# Patient Record
Sex: Female | Born: 1955 | Race: White | Hispanic: No | Marital: Married | Smoking: Never smoker
Health system: Southern US, Community
[De-identification: ages and names within clinical notes are randomized; demographics above are authoritative.]

## PROBLEM LIST (undated history)

## (undated) DIAGNOSIS — R928 Other abnormal and inconclusive findings on diagnostic imaging of breast: Secondary | ICD-10-CM

## (undated) DIAGNOSIS — I10 Essential (primary) hypertension: Secondary | ICD-10-CM

## (undated) HISTORY — DX: Essential (primary) hypertension: I10

## (undated) HISTORY — DX: Other abnormal and inconclusive findings on diagnostic imaging of breast: R92.8

---

## 2003-10-08 ENCOUNTER — Encounter: Payer: Self-pay | Admitting: Family Medicine

## 2003-10-08 LAB — CONVERTED CEMR LAB: Pap Smear: NORMAL

## 2004-05-07 DIAGNOSIS — E785 Hyperlipidemia, unspecified: Secondary | ICD-10-CM

## 2004-05-07 DIAGNOSIS — I10 Essential (primary) hypertension: Secondary | ICD-10-CM | POA: Insufficient documentation

## 2004-05-07 DIAGNOSIS — R7309 Other abnormal glucose: Secondary | ICD-10-CM

## 2004-07-13 ENCOUNTER — Ambulatory Visit: Payer: Self-pay | Admitting: Family Medicine

## 2004-12-24 ENCOUNTER — Ambulatory Visit: Payer: Self-pay | Admitting: Family Medicine

## 2005-01-12 ENCOUNTER — Ambulatory Visit: Payer: Self-pay | Admitting: Family Medicine

## 2005-07-15 ENCOUNTER — Ambulatory Visit: Payer: Self-pay | Admitting: Family Medicine

## 2005-07-20 ENCOUNTER — Ambulatory Visit: Payer: Self-pay | Admitting: Unknown Physician Specialty

## 2005-08-03 ENCOUNTER — Ambulatory Visit: Payer: Self-pay | Admitting: Family Medicine

## 2007-05-24 ENCOUNTER — Encounter: Payer: Self-pay | Admitting: Family Medicine

## 2007-06-20 ENCOUNTER — Ambulatory Visit: Payer: Self-pay | Admitting: Family Medicine

## 2007-06-20 LAB — CONVERTED CEMR LAB
Bilirubin, Direct: 0.1 mg/dL (ref 0.0–0.3)
CO2: 29 meq/L (ref 19–32)
Cholesterol: 192 mg/dL (ref 0–200)
GFR calc Af Amer: 113 mL/min
Glucose, Bld: 113 mg/dL — ABNORMAL HIGH (ref 70–99)
HDL: 35.2 mg/dL — ABNORMAL LOW (ref >39.0)
Microalb Creat Ratio: 5.3 mg/g (ref 0.0–30.0)
Microalb, Ur: 0.6 mg/dL (ref 0.0–1.9)
Potassium: 4.1 meq/L (ref 3.5–5.1)
Total Bilirubin: 0.8 mg/dL (ref 0.3–1.2)
Total Protein: 6.7 g/dL (ref 6.0–8.3)
Triglycerides: 277 mg/dL (ref 0–149)

## 2007-06-22 ENCOUNTER — Ambulatory Visit: Payer: Self-pay | Admitting: Family Medicine

## 2007-09-04 ENCOUNTER — Ambulatory Visit: Payer: Self-pay | Admitting: Family Medicine

## 2007-09-04 DIAGNOSIS — J069 Acute upper respiratory infection, unspecified: Secondary | ICD-10-CM | POA: Insufficient documentation

## 2007-09-19 ENCOUNTER — Ambulatory Visit: Payer: Self-pay | Admitting: Unknown Physician Specialty

## 2007-09-26 ENCOUNTER — Ambulatory Visit: Payer: Self-pay | Admitting: Unknown Physician Specialty

## 2008-11-26 ENCOUNTER — Ambulatory Visit: Payer: Self-pay | Admitting: Unknown Physician Specialty

## 2009-02-12 LAB — CONVERTED CEMR LAB: Pap Smear: NORMAL

## 2009-04-07 ENCOUNTER — Ambulatory Visit: Payer: Self-pay | Admitting: Family Medicine

## 2009-04-07 LAB — CONVERTED CEMR LAB
Albumin: 3.8 g/dL (ref 3.5–5.2)
Alkaline Phosphatase: 53 units/L (ref 39–117)
Bilirubin, Direct: 0.1 mg/dL (ref 0.0–0.3)
CO2: 30 meq/L (ref 19–32)
Chloride: 107 meq/L (ref 96–112)
Cholesterol: 193 mg/dL (ref 0–200)
Creatinine, Ser: 0.7 mg/dL (ref 0.4–1.2)
Direct LDL: 114.1 mg/dL
HDL: 41.2 mg/dL (ref >39.00)
Microalb Creat Ratio: 8.7 mg/g (ref 0.0–30.0)
Microalb, Ur: 1.3 mg/dL (ref 0.0–1.9)
Potassium: 4.1 meq/L (ref 3.5–5.1)
TSH: 1.07 microintl units/mL (ref 0.35–5.50)
Total CHOL/HDL Ratio: 5
VLDL: 52.4 mg/dL — ABNORMAL HIGH (ref 0.0–40.0)

## 2009-04-15 ENCOUNTER — Ambulatory Visit: Payer: Self-pay | Admitting: Family Medicine

## 2010-04-14 ENCOUNTER — Encounter (INDEPENDENT_AMBULATORY_CARE_PROVIDER_SITE_OTHER): Payer: Self-pay | Admitting: *Deleted

## 2010-07-21 ENCOUNTER — Ambulatory Visit: Payer: Self-pay | Admitting: Unknown Physician Specialty

## 2010-10-07 NOTE — Letter (Signed)
Summary: Nadara Eaton letter  [facility]  [address] [place], [place] [address]   Phone: [phone]  Fax: [phone]       04/14/2010 MRN: [id number]  [facility] [address] [place], [place]  [address]  Dear Ms. Zella Ball,  [facility] [place], and [facility] announce the retirement of Arta Silence, M.D., from full-time practice at the [facility] office effective March 05, 2010 and his plans of returning part-time.  It is important to Dr. Hetty Ely and to our practice that you understand that [facility] has seven physicians in our office for your health care needs.  We will continue to offer the same exceptional care that you have today.    Dr. Hetty Ely has spoken to many of you about his plans for retirement and returning part-time in the fall.   We will continue to work with you through the transition to schedule appointments for you in the office and meet the high standards that [place] is committed to.   Again, it is with great pleasure that we share the news that Dr. Hetty Ely will return to [facility] in October of 2011 with a reduced schedule.    If you have any questions, or would like to request an appointment with one of our physicians, please call us at [phone] and press the option for Scheduling an appointment.  We take pleasure in providing you with excellent patient care and look forward to seeing you at your next office visit.  Our [facility] Physicians are:  Tillman Abide, M.D. Laurita Quint, M.D. Roxy Manns, M.D. Kerby Nora, M.D. Hannah Beat, M.D. Ruthe Mannan, M.D. We proudly welcomed Raechel Ache, M.D. and Eustaquio Boyden, M.D. to the practice in July/August 2011.  Sincerely,  [facility]

## 2012-12-26 ENCOUNTER — Ambulatory Visit: Payer: Self-pay | Admitting: Obstetrics & Gynecology

## 2013-01-22 ENCOUNTER — Ambulatory Visit: Payer: Self-pay | Admitting: General Surgery

## 2013-01-31 ENCOUNTER — Encounter: Payer: Self-pay | Admitting: *Deleted

## 2013-02-04 HISTORY — PX: BREAST CYST ASPIRATION: SHX578

## 2013-02-13 ENCOUNTER — Other Ambulatory Visit: Payer: Self-pay

## 2013-02-13 ENCOUNTER — Ambulatory Visit (INDEPENDENT_AMBULATORY_CARE_PROVIDER_SITE_OTHER): Payer: BC Managed Care – PPO | Admitting: General Surgery

## 2013-02-13 ENCOUNTER — Encounter: Payer: Self-pay | Admitting: General Surgery

## 2013-02-13 VITALS — BP 140/76 | HR 72 | Resp 14 | Ht 66.0 in | Wt 183.0 lb

## 2013-02-13 DIAGNOSIS — N63 Unspecified lump in unspecified breast: Secondary | ICD-10-CM

## 2013-02-13 DIAGNOSIS — R928 Other abnormal and inconclusive findings on diagnostic imaging of breast: Secondary | ICD-10-CM

## 2013-02-13 NOTE — Patient Instructions (Signed)
Patient to return 3 months.

## 2013-02-13 NOTE — Progress Notes (Signed)
Patient ID: Maria Vang, female   DOB: [birth year], 57 y.o.   MRN: [id number]  Chief Complaint  Patient presents with  . Breast Problem    abnormal mammogram category 4    HPI Maria Vang is a 57 y.o. female who presents for an abnormal mammogram. The most recent mammogram was done on 12/26/12 with a birad category 4. A left breast ultrasound was done at this time with a birad category 4. Patient admits performing regular self breast checks and gets regular mammograms done. No family or personal history of breast problems. She states no complaints at this time.  The patient had initially reported some ill-defined thickening this area at the time of her recent GYN exam. The areas no longer as distinct as it had been in the past.  HPI  Past Medical History  Diagnosis Date  . Hypertension   . Abnormal mammogram 02/14/2013    History reviewed. No pertinent past surgical history.  History reviewed. No pertinent family history.  Social History History  Substance Use Topics  . Smoking status: Never Smoker   . Smokeless tobacco: Never Used  . Alcohol Use: Yes    No Known Allergies  Current Outpatient Prescriptions  Medication Sig Dispense Refill  . metoprolol succinate (TOPROL-XL) 25 MG 24 hr tablet Take 0.5 tablets by mouth daily.       No current facility-administered medications for this visit.    Review of Systems Review of Systems  Constitutional: Negative.   Respiratory: Negative.   Cardiovascular: Negative.     Blood pressure 140/76, pulse 72, resp. rate 14, height 5\' 6"  (1.676 m), weight 183 lb (83.008 kg).  Physical Exam Physical Exam  Constitutional: She appears well-developed and well-nourished.  Neck: Trachea normal. No mass and no thyromegaly present.  Cardiovascular: Normal rate, regular rhythm, normal heart sounds and normal pulses.   No murmur heard. Pulmonary/Chest: Effort normal and breath sounds normal. Right breast exhibits no inverted nipple, no  mass, no nipple discharge, no skin change and no tenderness. Left breast exhibits no inverted nipple, no mass, no nipple discharge, no skin change and no tenderness. Breasts are symmetrical.  Lymphadenopathy:    She has no cervical adenopathy.    She has no axillary adenopathy.    Data Reviewed Bilateral mammograms dated 12/26/2012 for a reported mass in the 3:00 position of the left breast were reviewed. A nodular density in the area of concern left breast was identified. Ultrasound showed 2 adjacent lesions the largest measuring just under 1.3 cm, the smallest measuring under 0.6 cm BI-RAD-4.  Ultrasound examination of the left breast in the 2:00 position 4 cm from the nipple showed adjacent nodules measuring just under 0.6 cm in aggregate. These were aspirated with complete resolution using 1 cc of 1% plain Xylocaine. At the 2:30 o'clock position, again 4 cm from the nipple a 0.36 x 0.66 x 0.55 cm slightly irregular hypoechoic nodule with some posterior acoustic enhancement was identified. Multiple passes through this lesion were completed with near complete resolution. Procedure was well tolerated slides x4 were prepared for cytology.  Assessment    Abnormal mammogram, likely resolving cyst.    Plan    The patient will be contacted when the cytology is available to assuming a benign report arrangements were made for followup exam in 3 months to reassess the area.       Earline Mayotte 02/14/2013, 3:45 PM

## 2013-02-14 ENCOUNTER — Encounter: Payer: Self-pay | Admitting: General Surgery

## 2013-02-14 DIAGNOSIS — R928 Other abnormal and inconclusive findings on diagnostic imaging of breast: Secondary | ICD-10-CM

## 2013-02-14 HISTORY — DX: Other abnormal and inconclusive findings on diagnostic imaging of breast: R92.8

## 2013-02-16 LAB — FINE-NEEDLE ASPIRATION

## 2013-02-20 ENCOUNTER — Telehealth: Payer: Self-pay | Admitting: General Surgery

## 2013-02-20 NOTE — Telephone Encounter (Signed)
A message was left for the patient on her cell phone that the cytology report was fine. Will follow through with our plan follow up in 3 months, earlier if problems arise.

## 2013-05-22 ENCOUNTER — Ambulatory Visit: Payer: BC Managed Care – PPO | Admitting: General Surgery

## 2014-07-08 ENCOUNTER — Encounter: Payer: Self-pay | Admitting: General Surgery

## 2014-08-27 ENCOUNTER — Emergency Department: Payer: Self-pay | Admitting: Emergency Medicine

## 2014-08-27 LAB — CBC
HCT: 42.5 % (ref 35.0–47.0)
HGB: 14.1 g/dL (ref 12.0–16.0)
MCH: 29.3 pg (ref 26.0–34.0)
MCHC: 33.2 g/dL (ref 32.0–36.0)
MCV: 88 fL (ref 80–100)
PLATELETS: 134 10*3/uL — AB (ref 150–440)
RBC: 4.83 10*6/uL (ref 3.80–5.20)
RDW: 13.4 % (ref 11.5–14.5)
WBC: 9.3 10*3/uL (ref 3.6–11.0)

## 2014-08-27 LAB — BASIC METABOLIC PANEL
ANION GAP: 6 — AB (ref 7–16)
BUN: 11 mg/dL (ref 7–18)
CO2: 27 mmol/L (ref 21–32)
Calcium, Total: 8.1 mg/dL — ABNORMAL LOW (ref 8.5–10.1)
Chloride: 104 mmol/L (ref 98–107)
Creatinine: 0.7 mg/dL (ref 0.60–1.30)
EGFR (African American): >60
EGFR (Non-African Amer.): >60
Glucose: 108 mg/dL — ABNORMAL HIGH (ref 65–99)
Osmolality: 274 (ref 275–301)
POTASSIUM: 4.2 mmol/L (ref 3.5–5.1)
Sodium: 137 mmol/L (ref 136–145)

## 2014-08-27 LAB — TROPONIN I: Troponin-I: <0.02 ng/mL

## 2015-03-02 IMAGING — MG MM CAD DIAGNOSTIC MAMMO
1 series · 8 of 8 positions shown · non-contrast
Comparison: none

REASON FOR EXAM: lft breast lump 0oclock and yearly
COMMENTS:

[R CC · right · 8 of 8 slices shown]
[im 1/8]
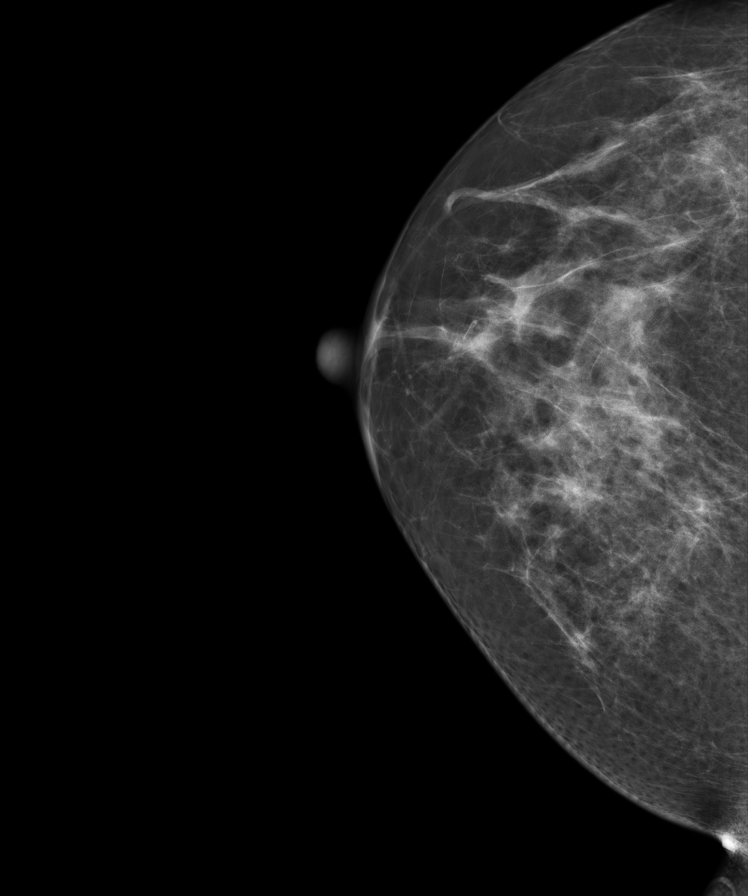
[im 2/8]
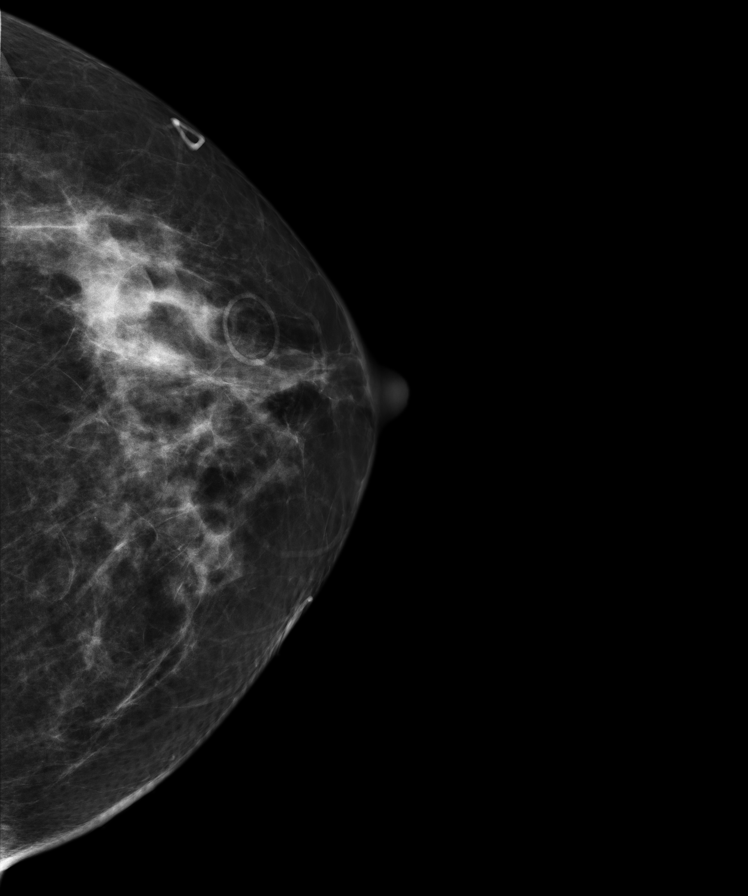
[im 3/8]
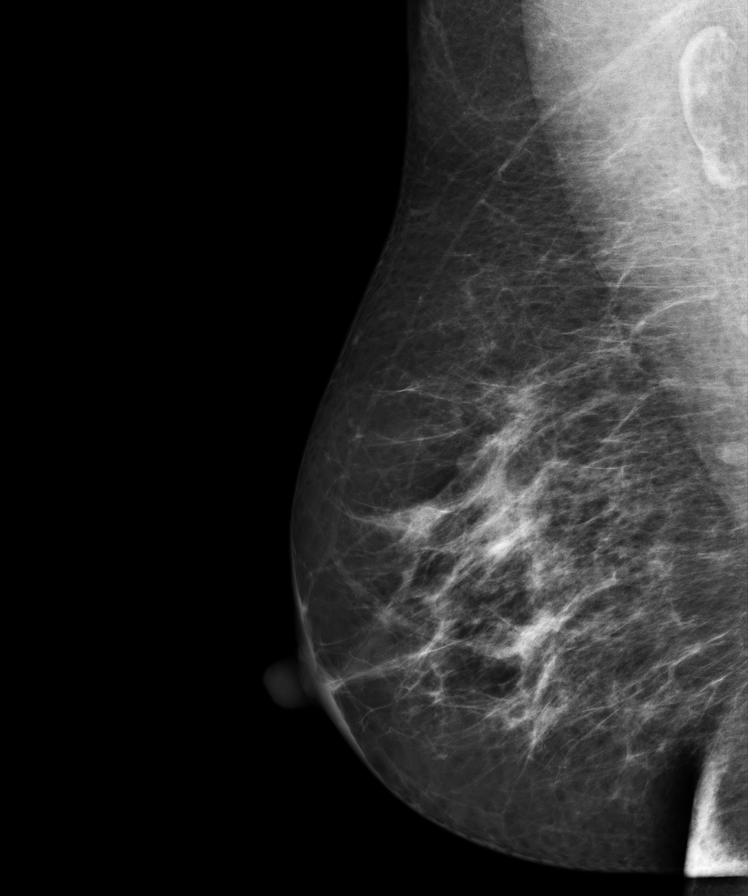
[im 4/8]
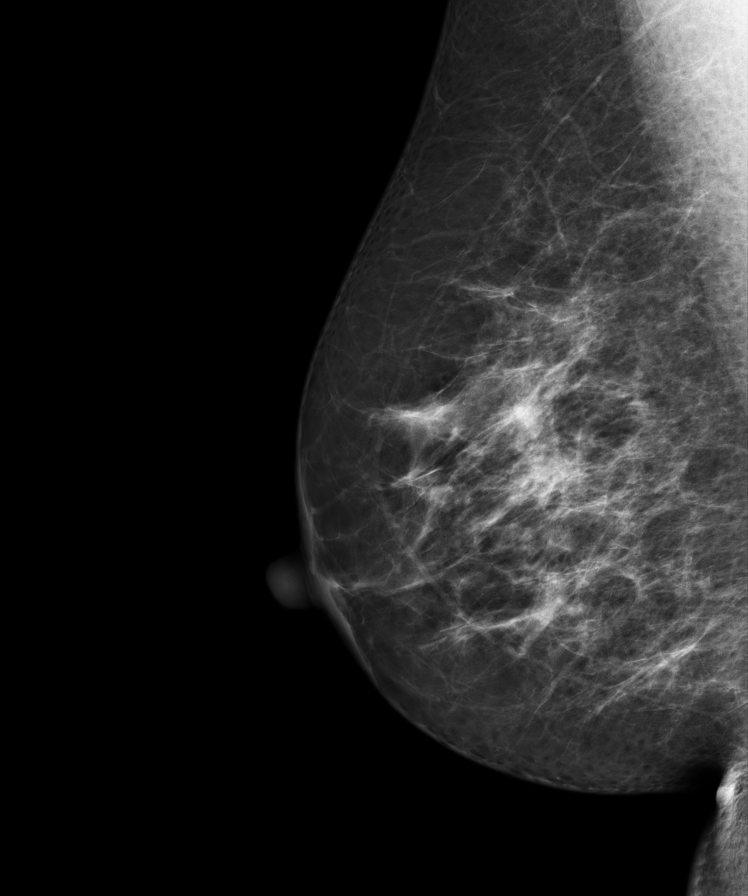
[im 5/8]
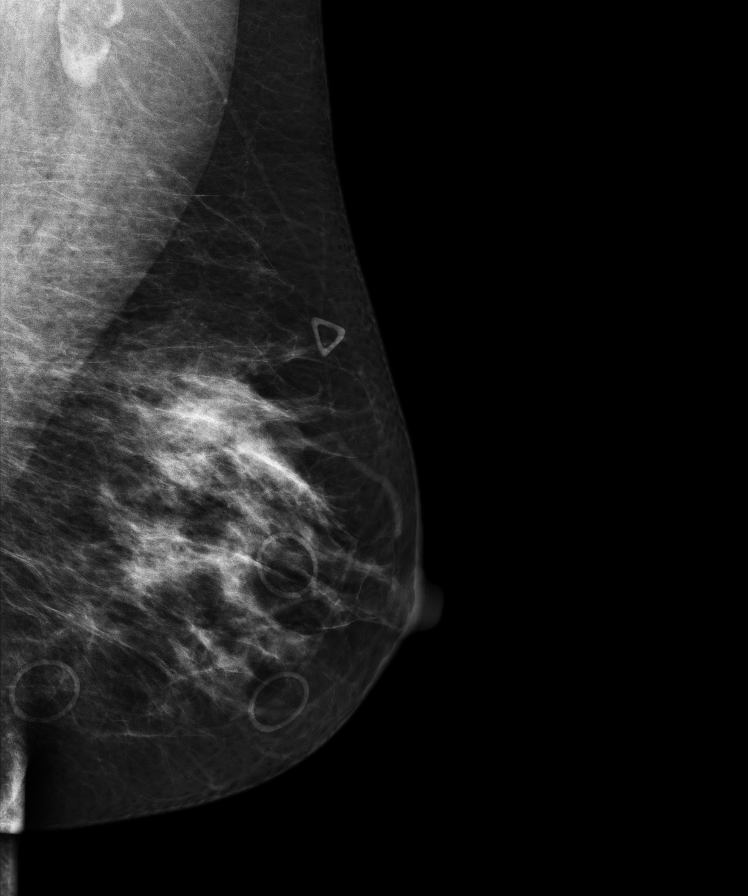
[im 6/8]
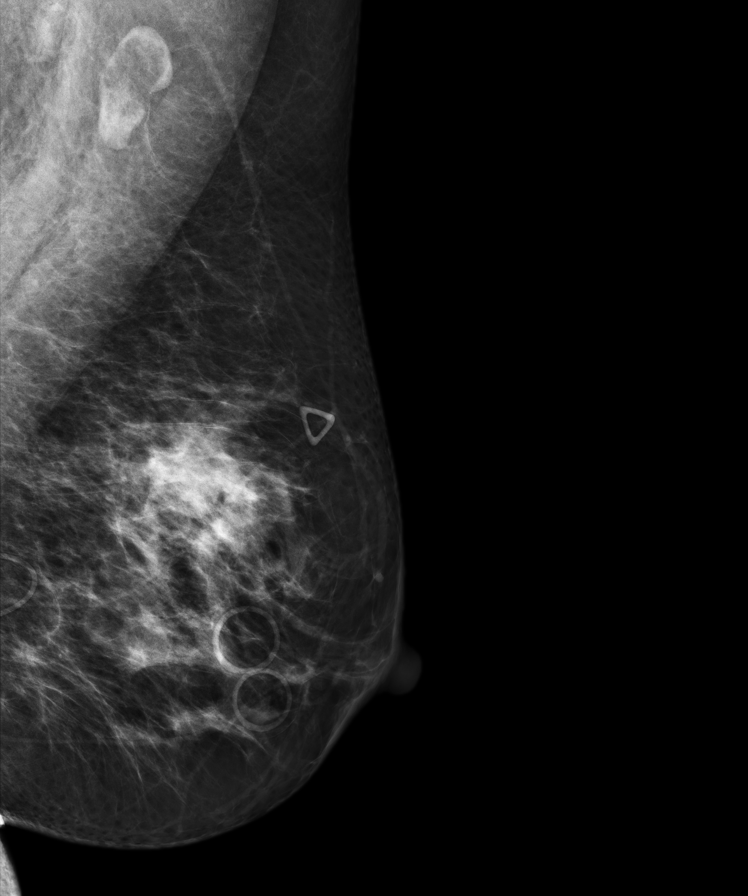
[im 7/8]
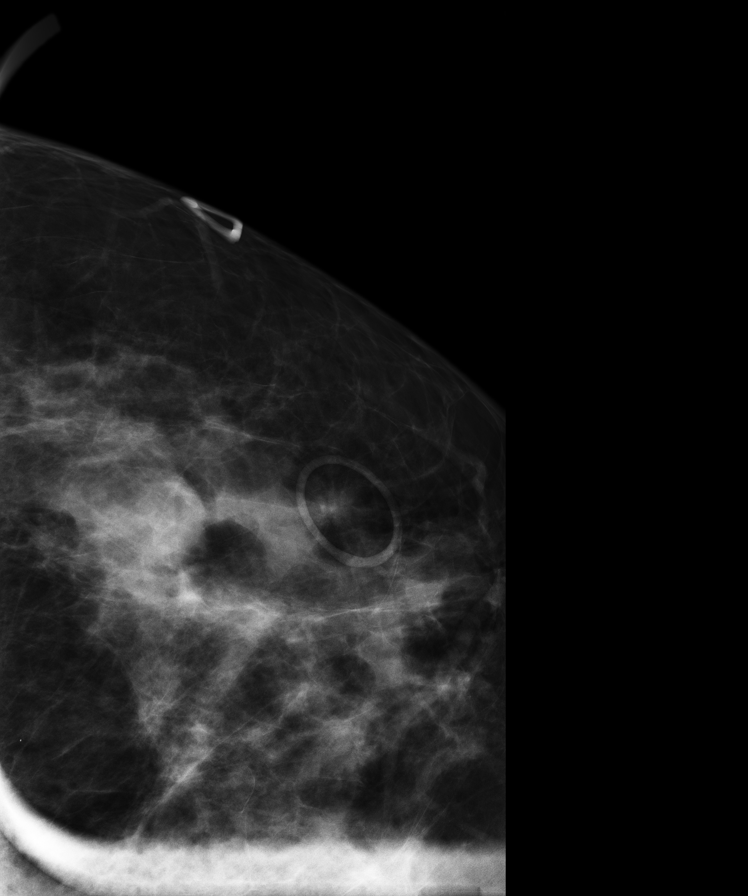
[im 8/8]
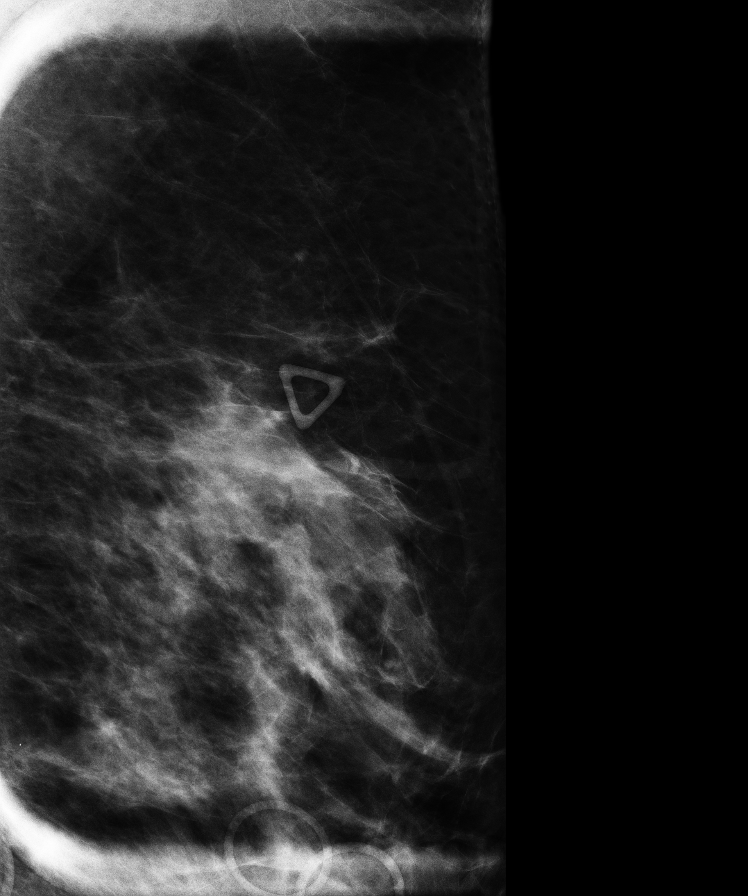

[8 of 8 positions shown; findings below may reference images not displayed]

PROCEDURE:     MAM - MAM DGTL DIAGNOSTIC MAMMO W/CAD  - December 26, 2012  [DATE]

RESULT:     And there is no personal or family history of breast cancer. The
patient denies previous breast surgery. There is a area of tenderness and
palpable nodular density in the [DATE] region of the left breast. This is
marked. A comparison is made to previous digital images a dated 21 December, 2011 from [facility] an to [HOSPITAL] images dated 21 July, 2010 as well as 26 November, 2008 an 20 July, 2005. The breasts exhibit a
heterogeneously dense pattern of parenchyma bilaterally. Deep to the marker
in the left breast on the cc image there is a curvilinear margin of what
appears to be a mass in the area of the most dense parenchyma in the lateral
left breast. This is somewhat better appreciated on the additional
notification compression cc view. This is not as well seen on the MLO
projection. Targeted ultrasound is dictated separately. Elsewhere the breast
parenchyma appears to be stable without a developing or dominant mass or
malignant calcification.
IMPRESSION: 1. Nodular density in the area of concern in the left breast for which
ultrasound has been performed. This demonstrates adjacent areas of decreased
echogenicity. Surgical consultation is recommended for consideration of
aspiration biopsy or cyst aspiration. BREAST COMPOSITION: The breast
composition is HETEROGENEOUSLY DENSE (glandular tissue is 51-75%) This may
decrease the sensitivity of mammography.  BI-RADS: Category 4 - Suspicious
Abnormality - Biopsy Should Be Considered

[REDACTED]

## 2016-03-24 ENCOUNTER — Other Ambulatory Visit: Payer: Self-pay | Admitting: Family Medicine

## 2016-03-24 DIAGNOSIS — Z1231 Encounter for screening mammogram for malignant neoplasm of breast: Secondary | ICD-10-CM

## 2016-04-09 ENCOUNTER — Ambulatory Visit
Admission: RE | Admit: 2016-04-09 | Discharge: 2016-04-09 | Disposition: A | Discharge status: Home / Self Care | Payer: BLUE CROSS/BLUE SHIELD | Source: Ambulatory Visit | Attending: Family Medicine | Admitting: Family Medicine

## 2016-04-09 ENCOUNTER — Other Ambulatory Visit: Payer: Self-pay | Admitting: Family Medicine

## 2016-04-09 DIAGNOSIS — Z1231 Encounter for screening mammogram for malignant neoplasm of breast: Secondary | ICD-10-CM | POA: Diagnosis present

## 2017-06-20 ENCOUNTER — Other Ambulatory Visit: Payer: Self-pay | Admitting: Family Medicine

## 2017-06-20 DIAGNOSIS — Z1231 Encounter for screening mammogram for malignant neoplasm of breast: Secondary | ICD-10-CM

## 2017-07-06 ENCOUNTER — Ambulatory Visit
Admission: RE | Admit: 2017-07-06 | Discharge: 2017-07-06 | Disposition: A | Discharge status: Home / Self Care | Payer: BLUE CROSS/BLUE SHIELD | Source: Ambulatory Visit | Attending: Family Medicine | Admitting: Family Medicine

## 2017-07-06 DIAGNOSIS — Z1231 Encounter for screening mammogram for malignant neoplasm of breast: Secondary | ICD-10-CM | POA: Diagnosis not present

## 2018-09-27 ENCOUNTER — Other Ambulatory Visit: Payer: Self-pay | Admitting: Family Medicine

## 2018-09-27 DIAGNOSIS — Z1231 Encounter for screening mammogram for malignant neoplasm of breast: Secondary | ICD-10-CM

## 2018-10-16 ENCOUNTER — Ambulatory Visit
Admission: RE | Admit: 2018-10-16 | Discharge: 2018-10-16 | Disposition: A | Discharge status: Home / Self Care | Payer: BLUE CROSS/BLUE SHIELD | Source: Ambulatory Visit | Attending: Family Medicine | Admitting: Family Medicine

## 2018-10-16 DIAGNOSIS — Z1231 Encounter for screening mammogram for malignant neoplasm of breast: Secondary | ICD-10-CM | POA: Diagnosis present

## 2018-10-18 ENCOUNTER — Other Ambulatory Visit: Payer: Self-pay | Admitting: Family Medicine

## 2018-10-18 DIAGNOSIS — R928 Other abnormal and inconclusive findings on diagnostic imaging of breast: Secondary | ICD-10-CM

## 2018-10-18 DIAGNOSIS — N6489 Other specified disorders of breast: Secondary | ICD-10-CM

## 2018-10-18 DIAGNOSIS — N6320 Unspecified lump in the left breast, unspecified quadrant: Secondary | ICD-10-CM

## 2018-10-26 ENCOUNTER — Ambulatory Visit
Admission: RE | Admit: 2018-10-26 | Discharge: 2018-10-26 | Disposition: A | Discharge status: Home / Self Care | Payer: BLUE CROSS/BLUE SHIELD | Source: Ambulatory Visit | Attending: Family Medicine | Admitting: Family Medicine

## 2018-10-26 DIAGNOSIS — R928 Other abnormal and inconclusive findings on diagnostic imaging of breast: Secondary | ICD-10-CM

## 2018-10-26 DIAGNOSIS — N6320 Unspecified lump in the left breast, unspecified quadrant: Secondary | ICD-10-CM

## 2018-10-26 DIAGNOSIS — N6489 Other specified disorders of breast: Secondary | ICD-10-CM | POA: Diagnosis present

## 2019-10-01 ENCOUNTER — Ambulatory Visit: Payer: Medicare Other | Attending: Internal Medicine

## 2019-10-01 DIAGNOSIS — Z23 Encounter for immunization: Secondary | ICD-10-CM | POA: Insufficient documentation

## 2019-10-01 NOTE — Progress Notes (Signed)
   Covid-19 Vaccination Clinic  Name:  Maria Vang    MRN: [id number] DOB: 1955/12/13  10/01/2019  [name] was observed post Covid-19 immunization for 15 minutes without incidence. She was provided with Vaccine Information Sheet and instruction to access the V-Safe system.   [name] was instructed to call 911 with any severe reactions post vaccine: Marland Kitchen Difficulty breathing  . Swelling of your face and throat  . A fast heartbeat  . A bad rash all over your body  . Dizziness and weakness    Immunizations Administered    Name Date Dose VIS Date Route   Pfizer COVID-19 Vaccine 10/01/2019 10:11 AM 0.3 mL 08/17/2019 Intramuscular   Manufacturer: [employer], [employer]   Lot: [id number]   NDC: [address]-4

## 2019-10-22 ENCOUNTER — Ambulatory Visit: Payer: Medicare Other | Attending: Internal Medicine

## 2019-10-22 DIAGNOSIS — Z23 Encounter for immunization: Secondary | ICD-10-CM | POA: Insufficient documentation

## 2019-10-22 NOTE — Progress Notes (Signed)
   Covid-19 Vaccination Clinic  Name:  Maria Vang    MRN: [id number] DOB: 1956-09-01  10/22/2019  [name] was observed post Covid-19 immunization for 15 minutes without incidence. She was provided with Vaccine Information Sheet and instruction to access the V-Safe system.   [name] was instructed to call 911 with any severe reactions post vaccine: Marland Kitchen Difficulty breathing  . Swelling of your face and throat  . A fast heartbeat  . A bad rash all over your body  . Dizziness and weakness    Immunizations Administered    Name Date Dose VIS Date Route   Pfizer COVID-19 Vaccine 10/22/2019 11:08 AM 0.3 mL 08/17/2019 Intramuscular   Manufacturer: [employer], [employer]   Lot: [id number]   NDC: [address]-4

## 2020-03-19 ENCOUNTER — Other Ambulatory Visit: Payer: Self-pay | Admitting: Family Medicine

## 2020-03-19 DIAGNOSIS — Z1231 Encounter for screening mammogram for malignant neoplasm of breast: Secondary | ICD-10-CM

## 2020-03-20 ENCOUNTER — Ambulatory Visit
Admission: RE | Admit: 2020-03-20 | Discharge: 2020-03-20 | Disposition: A | Discharge status: Home / Self Care | Payer: Medicare Other | Source: Ambulatory Visit | Attending: Family Medicine | Admitting: Family Medicine

## 2020-03-20 DIAGNOSIS — Z1231 Encounter for screening mammogram for malignant neoplasm of breast: Secondary | ICD-10-CM

## 2021-12-21 ENCOUNTER — Other Ambulatory Visit: Payer: Self-pay | Admitting: Family Medicine

## 2021-12-21 DIAGNOSIS — Z1231 Encounter for screening mammogram for malignant neoplasm of breast: Secondary | ICD-10-CM

## 2022-01-22 ENCOUNTER — Ambulatory Visit
Admission: RE | Admit: 2022-01-22 | Discharge: 2022-01-22 | Disposition: A | Discharge status: Home / Self Care | Payer: Medicare Other | Source: Ambulatory Visit | Attending: Family Medicine | Admitting: Family Medicine

## 2022-01-22 DIAGNOSIS — Z1231 Encounter for screening mammogram for malignant neoplasm of breast: Secondary | ICD-10-CM | POA: Diagnosis present

## 2023-03-21 ENCOUNTER — Other Ambulatory Visit: Payer: Self-pay | Admitting: Family Medicine

## 2023-03-21 DIAGNOSIS — Z1231 Encounter for screening mammogram for malignant neoplasm of breast: Secondary | ICD-10-CM

## 2023-03-22 ENCOUNTER — Ambulatory Visit
Admission: RE | Admit: 2023-03-22 | Discharge: 2023-03-22 | Disposition: A | Discharge status: Home / Self Care | Payer: Medicare HMO | Source: Ambulatory Visit | Attending: Family Medicine | Admitting: Family Medicine

## 2023-03-22 DIAGNOSIS — Z1231 Encounter for screening mammogram for malignant neoplasm of breast: Secondary | ICD-10-CM | POA: Insufficient documentation

## 2024-04-27 ENCOUNTER — Other Ambulatory Visit: Payer: Self-pay | Admitting: Family Medicine

## 2024-04-27 DIAGNOSIS — Z1231 Encounter for screening mammogram for malignant neoplasm of breast: Secondary | ICD-10-CM

## 2024-05-25 ENCOUNTER — Ambulatory Visit
Admission: RE | Admit: 2024-05-25 | Discharge: 2024-05-25 | Disposition: A | Discharge status: Home / Self Care | Source: Ambulatory Visit | Attending: Family Medicine | Admitting: Family Medicine

## 2024-05-25 DIAGNOSIS — Z1231 Encounter for screening mammogram for malignant neoplasm of breast: Secondary | ICD-10-CM | POA: Insufficient documentation

## 2024-08-15 ENCOUNTER — Other Ambulatory Visit: Payer: Self-pay | Admitting: Family Medicine

## 2024-08-15 DIAGNOSIS — Z0000 Encounter for general adult medical examination without abnormal findings: Secondary | ICD-10-CM

## 2024-08-15 DIAGNOSIS — Z9189 Other specified personal risk factors, not elsewhere classified: Secondary | ICD-10-CM

## 2024-09-14 LAB — COLOGUARD: COLOGUARD: NEGATIVE

## 2024-09-18 ENCOUNTER — Ambulatory Visit
Admission: RE | Admit: 2024-09-18 | Discharge: 2024-09-18 | Disposition: A | Discharge status: Home / Self Care | Payer: Self-pay | Source: Ambulatory Visit | Attending: Family Medicine | Admitting: Family Medicine

## 2024-09-18 DIAGNOSIS — Z9189 Other specified personal risk factors, not elsewhere classified: Secondary | ICD-10-CM | POA: Insufficient documentation

## 2024-09-18 DIAGNOSIS — Z0000 Encounter for general adult medical examination without abnormal findings: Secondary | ICD-10-CM | POA: Insufficient documentation
